# Patient Record
Sex: Female | Born: 1945 | Race: White | Hispanic: No | Marital: Married | Smoking: Never smoker
Health system: Southern US, Community
[De-identification: ages and names within clinical notes are randomized; demographics above are authoritative.]

## PROBLEM LIST (undated history)

## (undated) DIAGNOSIS — K429 Umbilical hernia without obstruction or gangrene: Secondary | ICD-10-CM

## (undated) DIAGNOSIS — N812 Incomplete uterovaginal prolapse: Secondary | ICD-10-CM

## (undated) DIAGNOSIS — N816 Rectocele: Secondary | ICD-10-CM

## (undated) DIAGNOSIS — E119 Type 2 diabetes mellitus without complications: Secondary | ICD-10-CM

## (undated) HISTORY — DX: Incomplete uterovaginal prolapse: N81.2

## (undated) HISTORY — DX: Umbilical hernia without obstruction or gangrene: K42.9

## (undated) HISTORY — DX: Rectocele: N81.6

---

## 2005-09-15 ENCOUNTER — Ambulatory Visit (HOSPITAL_COMMUNITY): Admission: RE | Admit: 2005-09-15 | Discharge: 2005-09-15 | Payer: Self-pay | Admitting: Family Medicine

## 2007-10-04 ENCOUNTER — Ambulatory Visit (HOSPITAL_COMMUNITY): Admission: RE | Admit: 2007-10-04 | Discharge: 2007-10-04 | Payer: Self-pay | Admitting: Family Medicine

## 2007-12-30 ENCOUNTER — Other Ambulatory Visit: Admission: RE | Admit: 2007-12-30 | Discharge: 2007-12-30 | Payer: Self-pay | Admitting: Obstetrics and Gynecology

## 2009-02-20 ENCOUNTER — Other Ambulatory Visit: Admission: RE | Admit: 2009-02-20 | Discharge: 2009-02-20 | Payer: Self-pay | Admitting: Obstetrics and Gynecology

## 2009-02-20 ENCOUNTER — Ambulatory Visit (HOSPITAL_COMMUNITY): Admission: RE | Admit: 2009-02-20 | Discharge: 2009-02-20 | Payer: Self-pay | Admitting: Obstetrics and Gynecology

## 2010-03-23 ENCOUNTER — Encounter: Payer: Self-pay | Admitting: Family Medicine

## 2010-07-29 ENCOUNTER — Other Ambulatory Visit (HOSPITAL_COMMUNITY): Payer: Self-pay | Admitting: Family Medicine

## 2010-07-29 DIAGNOSIS — Z139 Encounter for screening, unspecified: Secondary | ICD-10-CM

## 2010-08-07 ENCOUNTER — Ambulatory Visit (HOSPITAL_COMMUNITY)
Admission: RE | Admit: 2010-08-07 | Discharge: 2010-08-07 | Disposition: A | Discharge status: Home / Self Care | Payer: 59 | Source: Ambulatory Visit | Attending: Family Medicine | Admitting: Family Medicine

## 2010-08-07 DIAGNOSIS — Z1231 Encounter for screening mammogram for malignant neoplasm of breast: Secondary | ICD-10-CM | POA: Insufficient documentation

## 2010-08-07 DIAGNOSIS — Z139 Encounter for screening, unspecified: Secondary | ICD-10-CM

## 2010-09-10 ENCOUNTER — Other Ambulatory Visit (HOSPITAL_COMMUNITY)
Admission: RE | Admit: 2010-09-10 | Discharge: 2010-09-10 | Disposition: A | Discharge status: Home / Self Care | Payer: 59 | Source: Ambulatory Visit | Attending: Obstetrics and Gynecology | Admitting: Obstetrics and Gynecology

## 2010-09-10 ENCOUNTER — Other Ambulatory Visit: Payer: Self-pay | Admitting: Adult Health

## 2010-09-10 DIAGNOSIS — Z01419 Encounter for gynecological examination (general) (routine) without abnormal findings: Secondary | ICD-10-CM | POA: Insufficient documentation

## 2011-08-06 ENCOUNTER — Other Ambulatory Visit: Payer: Self-pay | Admitting: Obstetrics and Gynecology

## 2011-08-06 DIAGNOSIS — Z139 Encounter for screening, unspecified: Secondary | ICD-10-CM

## 2011-08-13 ENCOUNTER — Ambulatory Visit (HOSPITAL_COMMUNITY)
Admission: RE | Admit: 2011-08-13 | Discharge: 2011-08-13 | Disposition: A | Discharge status: Home / Self Care | Payer: PRIVATE HEALTH INSURANCE | Source: Ambulatory Visit | Attending: Obstetrics and Gynecology | Admitting: Obstetrics and Gynecology

## 2011-08-13 DIAGNOSIS — Z1231 Encounter for screening mammogram for malignant neoplasm of breast: Secondary | ICD-10-CM | POA: Insufficient documentation

## 2011-08-13 DIAGNOSIS — Z139 Encounter for screening, unspecified: Secondary | ICD-10-CM

## 2011-09-15 ENCOUNTER — Other Ambulatory Visit: Payer: Self-pay | Admitting: Adult Health

## 2011-09-15 ENCOUNTER — Other Ambulatory Visit (HOSPITAL_COMMUNITY)
Admission: RE | Admit: 2011-09-15 | Discharge: 2011-09-15 | Disposition: A | Discharge status: Home / Self Care | Payer: PRIVATE HEALTH INSURANCE | Source: Ambulatory Visit | Attending: Obstetrics and Gynecology | Admitting: Obstetrics and Gynecology

## 2011-09-15 DIAGNOSIS — Z1159 Encounter for screening for other viral diseases: Secondary | ICD-10-CM | POA: Insufficient documentation

## 2011-09-15 DIAGNOSIS — Z01419 Encounter for gynecological examination (general) (routine) without abnormal findings: Secondary | ICD-10-CM | POA: Insufficient documentation

## 2013-08-22 ENCOUNTER — Other Ambulatory Visit (HOSPITAL_COMMUNITY): Payer: Self-pay | Admitting: Family Medicine

## 2013-08-22 DIAGNOSIS — Z1231 Encounter for screening mammogram for malignant neoplasm of breast: Secondary | ICD-10-CM

## 2013-08-24 ENCOUNTER — Ambulatory Visit (HOSPITAL_COMMUNITY)
Admission: RE | Admit: 2013-08-24 | Discharge: 2013-08-24 | Disposition: A | Discharge status: Home / Self Care | Payer: PRIVATE HEALTH INSURANCE | Source: Ambulatory Visit | Attending: Family Medicine | Admitting: Family Medicine

## 2013-08-24 DIAGNOSIS — Z1231 Encounter for screening mammogram for malignant neoplasm of breast: Secondary | ICD-10-CM | POA: Insufficient documentation

## 2013-09-08 ENCOUNTER — Encounter (INDEPENDENT_AMBULATORY_CARE_PROVIDER_SITE_OTHER): Payer: Self-pay | Admitting: *Deleted

## 2013-09-28 ENCOUNTER — Encounter: Payer: Self-pay | Admitting: Adult Health

## 2013-09-28 ENCOUNTER — Ambulatory Visit (INDEPENDENT_AMBULATORY_CARE_PROVIDER_SITE_OTHER): Payer: No Typology Code available for payment source | Admitting: Adult Health

## 2013-09-28 VITALS — BP 140/82 | HR 76 | Ht 63.0 in | Wt 158.0 lb

## 2013-09-28 DIAGNOSIS — N812 Incomplete uterovaginal prolapse: Secondary | ICD-10-CM

## 2013-09-28 DIAGNOSIS — K429 Umbilical hernia without obstruction or gangrene: Secondary | ICD-10-CM

## 2013-09-28 DIAGNOSIS — Z1212 Encounter for screening for malignant neoplasm of rectum: Secondary | ICD-10-CM

## 2013-09-28 DIAGNOSIS — N816 Rectocele: Secondary | ICD-10-CM

## 2013-09-28 DIAGNOSIS — Z01419 Encounter for gynecological examination (general) (routine) without abnormal findings: Secondary | ICD-10-CM

## 2013-09-28 HISTORY — DX: Incomplete uterovaginal prolapse: N81.2

## 2013-09-28 HISTORY — DX: Rectocele: N81.6

## 2013-09-28 LAB — HEMOCCULT GUIAC POC 1CARD (OFFICE): Fecal Occult Blood, POC: NEGATIVE

## 2013-09-28 NOTE — Progress Notes (Signed)
Patient ID: Kelsey Carr, female   DOB: 11/30/1945, 68 y.o.   MRN: [id number] History of Present Illness:  Kelsey Carr is a 68 year old white female, married in for a gyn physical.No complaints.She has seen Dr Regino SchultzeMcGough and he stooped her BP meds,since she has lost weight.Last pap 09/15/11 normal with negative HPV.  Current Medications, Allergies, Past Medical History, Past Surgical History, Family History and Social History were reviewed in [facility] electronic medical record.     Review of Systems: Patient denies any headaches, blurred vision, shortness of breath, chest pain, abdominal pain, problems with bowel movements, urination, or intercourse. No joint pain or mood swings.   Physical Exam:BP 140/82  Pulse 76  Ht 5\' 3"  (1.6 m)  Wt 158 lb (71.668 kg)  BMI 28.00 kg/m2 General:  Well developed, well nourished, no acute distress Skin:  Warm and dry Neck:  Midline trachea, normal thyroid Lungs; Clear to auscultation bilaterally Breast:  No dominant palpable mass, retraction, or nipple discharge Cardiovascular: Regular rate and rhythm Abdomen:  Soft, non tender, no hepatosplenomegaly, she has umbilical hernia but it does not bother her, discussed if it is out and hurts go to ER Pelvic:  External genitalia is normal in appearance.  The vagina has decrease moisture and rugae, color pale pink. The cervix is bulbous.Has first degree uterine descensus.  Uterus is felt to be normal size, shape, and contour.  No   adnexal masses or tenderness noted. Rectal: Good sphincter tone, no polyps, or hemorrhoids felt.  Hemoccult negative.+rectocele Extremities:  No swelling noted, has some spider veins Psych:  No mood changes, alert and cooperative,seems happy,still works   Impression: Yearly gyn exam no pap Rectocele First degree uterine descensus  Umbilical hernia   Plan: Physical and pap in  2 years Mammogram yearly  Labs with PCP Colonoscopy advised

## 2013-09-28 NOTE — Patient Instructions (Signed)
Pap and physical in  2 year Mammogram yearly Labs with PCP Colonoscopy advised

## 2014-01-01 ENCOUNTER — Encounter: Payer: Self-pay | Admitting: Adult Health

## 2015-08-29 ENCOUNTER — Other Ambulatory Visit (HOSPITAL_COMMUNITY): Payer: Self-pay | Admitting: Family Medicine

## 2015-08-29 DIAGNOSIS — Z1231 Encounter for screening mammogram for malignant neoplasm of breast: Secondary | ICD-10-CM

## 2015-09-02 ENCOUNTER — Ambulatory Visit (HOSPITAL_COMMUNITY)
Admission: RE | Admit: 2015-09-02 | Discharge: 2015-09-02 | Disposition: A | Discharge status: Home / Self Care | Payer: 59 | Source: Ambulatory Visit | Attending: Family Medicine | Admitting: Family Medicine

## 2015-09-02 DIAGNOSIS — Z1231 Encounter for screening mammogram for malignant neoplasm of breast: Secondary | ICD-10-CM | POA: Diagnosis not present

## 2015-09-30 ENCOUNTER — Encounter: Payer: Self-pay | Admitting: Adult Health

## 2015-09-30 ENCOUNTER — Other Ambulatory Visit (HOSPITAL_COMMUNITY)
Admission: RE | Admit: 2015-09-30 | Discharge: 2015-09-30 | Disposition: A | Discharge status: Home / Self Care | Payer: 59 | Source: Ambulatory Visit | Attending: Adult Health | Admitting: Adult Health

## 2015-09-30 ENCOUNTER — Ambulatory Visit (INDEPENDENT_AMBULATORY_CARE_PROVIDER_SITE_OTHER): Payer: 59 | Admitting: Adult Health

## 2015-09-30 VITALS — BP 162/80 | HR 88 | Ht 62.5 in | Wt 142.5 lb

## 2015-09-30 DIAGNOSIS — K429 Umbilical hernia without obstruction or gangrene: Secondary | ICD-10-CM

## 2015-09-30 DIAGNOSIS — Z1151 Encounter for screening for human papillomavirus (HPV): Secondary | ICD-10-CM | POA: Insufficient documentation

## 2015-09-30 DIAGNOSIS — N816 Rectocele: Secondary | ICD-10-CM

## 2015-09-30 DIAGNOSIS — Z01411 Encounter for gynecological examination (general) (routine) with abnormal findings: Secondary | ICD-10-CM | POA: Diagnosis not present

## 2015-09-30 DIAGNOSIS — Z01419 Encounter for gynecological examination (general) (routine) without abnormal findings: Secondary | ICD-10-CM | POA: Insufficient documentation

## 2015-09-30 DIAGNOSIS — Z1211 Encounter for screening for malignant neoplasm of colon: Secondary | ICD-10-CM | POA: Diagnosis not present

## 2015-09-30 LAB — HEMOCCULT GUIAC POC 1CARD (OFFICE): FECAL OCCULT BLD: NEGATIVE

## 2015-09-30 NOTE — Patient Instructions (Signed)
Mammogram yearly Colonoscopy advised Physical in 2 years

## 2015-09-30 NOTE — Progress Notes (Signed)
Patient ID: Kelsey Carr, female   DOB: January 03, 1946, 70 y.o.   MRN: [id number] History of Present Illness: Kelsey Carr is a 70 year old white female, widowed in for a well woman gyn exam and pap. PCP is Dr Phillips Odor.   Current Medications, Allergies, Past Medical History, Past Surgical History, Family History and Social History were reviewed in [facility] record.     Review of Systems: Patient denies any headaches, hearing loss, fatigue, blurred vision, shortness of breath, chest pain, abdominal pain, problems with bowel movements, urination, or intercourse(not having sex). No joint pain or mood swings.    Physical Exam:BP (!) 162/80 (BP Location: Left Arm, Patient Position: Sitting, Cuff Size: Normal)   Pulse 88   Ht 5' 2.5" (1.588 m)   Wt 142 lb 8 oz (64.6 kg)   BMI 25.65 kg/m  General:  Well developed, well nourished, no acute distress Skin:  Warm and dry Neck:  Midline trachea, normal thyroid, good ROM, no lymphadenopathy,no carotid bruits heard  Lungs; Clear to auscultation bilaterally Breast:  No dominant palpable mass, retraction, or nipple discharge Cardiovascular: Regular rate and rhythm Abdomen:  Soft, non tender, no hepatosplenomegaly,has umbilical hernia  Pelvic:  External genitalia is normal in appearance, no lesions.  The vagina is pale with decreased color, moisture and rugae. Urethra has no lesions or masses. The cervix is smooth and atrophic with stenotic os, pap with HPV pefformed.  Uterus is felt to be normal size, shape, and contour.  No adnexal massewes or tenderness noted.Bladder is non tender, no masses felt. Rectal: Good sphincter tone, no polyps, or hemorrhoids felt.  Hemoccult negative.+rectocele Extremities/musculoskeletal:  No swelling or varicosities noted, no clubbing or cyanosis Psych:  No mood changes, alert and cooperative,seems happy   Impression: Well woman gyn exam and pap Rectocele  Umbilical hernia     Plan: Physical in 2  years ,can stop paps if desires or not Mammogram yearly Colonoscopy advised Labs with PCP

## 2015-10-01 LAB — CYTOLOGY - PAP

## 2015-10-17 ENCOUNTER — Encounter (INDEPENDENT_AMBULATORY_CARE_PROVIDER_SITE_OTHER): Payer: Self-pay | Admitting: *Deleted

## 2016-09-07 ENCOUNTER — Other Ambulatory Visit (HOSPITAL_COMMUNITY): Payer: Self-pay | Admitting: Family Medicine

## 2016-09-07 DIAGNOSIS — Z1231 Encounter for screening mammogram for malignant neoplasm of breast: Secondary | ICD-10-CM

## 2016-09-08 ENCOUNTER — Encounter (INDEPENDENT_AMBULATORY_CARE_PROVIDER_SITE_OTHER): Payer: Self-pay | Admitting: *Deleted

## 2016-09-14 ENCOUNTER — Ambulatory Visit (HOSPITAL_COMMUNITY)
Admission: RE | Admit: 2016-09-14 | Discharge: 2016-09-14 | Disposition: A | Discharge status: Home / Self Care | Payer: 59 | Source: Ambulatory Visit | Attending: Family Medicine | Admitting: Family Medicine

## 2016-09-14 DIAGNOSIS — Z1231 Encounter for screening mammogram for malignant neoplasm of breast: Secondary | ICD-10-CM | POA: Diagnosis present

## 2016-10-01 ENCOUNTER — Ambulatory Visit (INDEPENDENT_AMBULATORY_CARE_PROVIDER_SITE_OTHER): Payer: 59 | Admitting: Adult Health

## 2016-10-01 ENCOUNTER — Encounter: Payer: Self-pay | Admitting: Adult Health

## 2016-10-01 VITALS — BP 138/78 | HR 80 | Ht 62.0 in | Wt 131.0 lb

## 2016-10-01 DIAGNOSIS — Z1212 Encounter for screening for malignant neoplasm of rectum: Secondary | ICD-10-CM

## 2016-10-01 DIAGNOSIS — Z1211 Encounter for screening for malignant neoplasm of colon: Secondary | ICD-10-CM | POA: Insufficient documentation

## 2016-10-01 DIAGNOSIS — Z01419 Encounter for gynecological examination (general) (routine) without abnormal findings: Secondary | ICD-10-CM

## 2016-10-01 DIAGNOSIS — K429 Umbilical hernia without obstruction or gangrene: Secondary | ICD-10-CM

## 2016-10-01 DIAGNOSIS — N816 Rectocele: Secondary | ICD-10-CM

## 2016-10-01 LAB — HEMOCCULT GUIAC POC 1CARD (OFFICE): FECAL OCCULT BLD: NEGATIVE

## 2016-10-01 NOTE — Patient Instructions (Signed)
Physical in 1 year 

## 2016-10-01 NOTE — Progress Notes (Signed)
Patient ID: Kelsey QuakerDonna M Carr, female   DOB: 04/30/1945, 71 y.o.   MRN: [id number] History of Present Illness: Kelsey LeashDonna is a 71 year old white female, widowed in for a well woman gyn exam, she had a normal pap with negative HPV 09/30/15. PCP is Dr Phillips OdorGolding.    Current Medications, Allergies, Past Medical History, Past Surgical History, Family History and Social History were reviewed in [facility] electronic medical record.     Review of Systems: Patient denies any headaches, hearing loss, fatigue, blurred vision, shortness of breath, chest pain, abdominal pain, problems with bowel movements, urination, or intercourse(not having sex). No joint pain or mood swings.    Physical Exam: General:  Well developed, well nourished, no acute distress Skin:  Warm and dry Neck:  Midline trachea, normal thyroid, good ROM, no lymphadenopathy,no carotid bruits heard Lungs; Clear to auscultation bilaterally Breast:  No dominant palpable mass, retraction, or nipple discharge Cardiovascular: Regular rate and rhythm Abdomen:  Soft, non tender, no hepatosplenomegaly,small umbilical hernia  Pelvic:  External genitalia is normal in appearance, no lesions.  The vagina is normal in appearance. Urethra has no lesions or masses. The cervix is bulbous. With first degree descensus. Uterus is felt to be normal size, shape, and contour.  No adnexal masses or tenderness noted.Bladder is non tender, no masses felt. Rectal: Good sphincter tone, no polyps, or hemorrhoids felt.  Hemoccult negative.+rectocele Extremities/musculoskeletal:  No swelling or varicosities noted, no clubbing or cyanosis Psych:  No mood changes, alert and cooperative,seems happy PHQ 2 score 0.  Impression: 1. Encounter for well woman exam   2. Rectocele   3. Umbilical hernia without obstruction and without gangrene   4. Screening for colorectal cancer       Plan: Physical in 1 year, pap in 2020 Mammogram yearly Colonoscopy advised Labs with  PCP

## 2017-08-16 ENCOUNTER — Telehealth: Payer: Self-pay | Admitting: *Deleted

## 2017-08-16 DIAGNOSIS — Z1212 Encounter for screening for malignant neoplasm of rectum: Principal | ICD-10-CM

## 2017-08-16 DIAGNOSIS — Z1211 Encounter for screening for malignant neoplasm of colon: Secondary | ICD-10-CM

## 2017-08-16 NOTE — Telephone Encounter (Signed)
[facility]

## 2017-08-31 ENCOUNTER — Ambulatory Visit: Payer: 59

## 2017-09-09 ENCOUNTER — Other Ambulatory Visit (HOSPITAL_COMMUNITY): Payer: Self-pay | Admitting: Family Medicine

## 2017-09-09 DIAGNOSIS — Z1231 Encounter for screening mammogram for malignant neoplasm of breast: Secondary | ICD-10-CM

## 2017-09-16 ENCOUNTER — Ambulatory Visit (HOSPITAL_COMMUNITY)
Admission: RE | Admit: 2017-09-16 | Discharge: 2017-09-16 | Disposition: A | Discharge status: Home / Self Care | Payer: 59 | Source: Ambulatory Visit | Attending: Family Medicine | Admitting: Family Medicine

## 2017-09-16 ENCOUNTER — Ambulatory Visit (HOSPITAL_COMMUNITY): Payer: 59

## 2017-09-16 ENCOUNTER — Encounter (HOSPITAL_COMMUNITY): Payer: Self-pay

## 2017-09-16 DIAGNOSIS — Z1231 Encounter for screening mammogram for malignant neoplasm of breast: Secondary | ICD-10-CM | POA: Diagnosis not present

## 2017-09-20 ENCOUNTER — Other Ambulatory Visit (HOSPITAL_COMMUNITY): Payer: Self-pay | Admitting: Family Medicine

## 2017-09-20 DIAGNOSIS — R928 Other abnormal and inconclusive findings on diagnostic imaging of breast: Secondary | ICD-10-CM

## 2017-09-21 LAB — COLOGUARD

## 2017-09-28 ENCOUNTER — Ambulatory Visit (HOSPITAL_COMMUNITY)
Admission: RE | Admit: 2017-09-28 | Discharge: 2017-09-28 | Disposition: A | Discharge status: Home / Self Care | Payer: 59 | Source: Ambulatory Visit | Attending: Family Medicine | Admitting: Family Medicine

## 2017-09-28 ENCOUNTER — Encounter (HOSPITAL_COMMUNITY): Payer: Self-pay

## 2017-09-28 DIAGNOSIS — R928 Other abnormal and inconclusive findings on diagnostic imaging of breast: Secondary | ICD-10-CM | POA: Diagnosis present

## 2017-10-04 ENCOUNTER — Other Ambulatory Visit (HOSPITAL_COMMUNITY)
Admission: RE | Admit: 2017-10-04 | Discharge: 2017-10-04 | Disposition: A | Discharge status: Home / Self Care | Payer: 59 | Source: Ambulatory Visit | Attending: Adult Health | Admitting: Adult Health

## 2017-10-04 ENCOUNTER — Ambulatory Visit (INDEPENDENT_AMBULATORY_CARE_PROVIDER_SITE_OTHER): Payer: 59 | Admitting: Adult Health

## 2017-10-04 ENCOUNTER — Encounter: Payer: Self-pay | Admitting: Adult Health

## 2017-10-04 ENCOUNTER — Telehealth: Payer: Self-pay | Admitting: Adult Health

## 2017-10-04 VITALS — BP 134/79 | HR 87 | Ht 63.0 in | Wt 121.0 lb

## 2017-10-04 DIAGNOSIS — Z1212 Encounter for screening for malignant neoplasm of rectum: Secondary | ICD-10-CM

## 2017-10-04 DIAGNOSIS — K429 Umbilical hernia without obstruction or gangrene: Secondary | ICD-10-CM

## 2017-10-04 DIAGNOSIS — Z1211 Encounter for screening for malignant neoplasm of colon: Secondary | ICD-10-CM

## 2017-10-04 DIAGNOSIS — Z01419 Encounter for gynecological examination (general) (routine) without abnormal findings: Secondary | ICD-10-CM

## 2017-10-04 DIAGNOSIS — N816 Rectocele: Secondary | ICD-10-CM

## 2017-10-04 LAB — HEMOCCULT GUIAC POC 1CARD (OFFICE): Fecal Occult Blood, POC: NEGATIVE

## 2017-10-04 NOTE — Telephone Encounter (Signed)
Left message that cologuard is negative, results came after your visit

## 2017-10-04 NOTE — Progress Notes (Signed)
Patient ID: Kelsey QuakerDonna M Carr, female   DOB: 03/15/1945, 72 y.o.   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  PCP is Dr Phillips OdorGolding.    Current Medications, Allergies, Past Medical History, Past Surgical History, Family History and Social History were reviewed in [facility] electronic medical record.     Review of Systems: Patient denies any headaches, hearing loss, fatigue, blurred vision, shortness of breath, chest pain, abdominal pain, problems with bowel movements,or intercourse.(not having sex). No joint pain or mood swings. +UI at times.    Physical Exam:BP 134/79 (BP Location: Left Arm, Patient Position: Sitting, Cuff Size: Normal)   Pulse 87   Ht 5\' 3"  (1.6 m)   Wt 121 lb (54.9 kg)   BMI 21.43 kg/m  General:  Well developed, well nourished, no acute distress Skin:  Warm and dry Neck:  Midline trachea, normal thyroid, good ROM, no lymphadenopathy,no carotid bruits heard. Lungs; Clear to auscultation bilaterally Breast:  No dominant palpable mass, retraction, or nipple discharge Cardiovascular: Regular rate and rhythm Abdomen:  Soft, non tender, no hepatosplenomegaly Pelvic:  External genitalia is normal in appearance, no lesions.  The vagina is normal in appearance for age with loss of color, moisture and rugae. Urethra has no lesions or masses. The cervix is smooth, pap with HPV performed.  Uterus is felt to be normal size, shape, and contour.  No adnexal masses or tenderness noted.Bladder is non tender, no masses felt. Rectal: Good sphincter tone, no polyps, or hemorrhoids felt.  Hemoccult negative.+rectocele. Extremities/musculoskeletal:  No swelling or varicosities noted, no clubbing or cyanosis Psych:  No mood changes, alert and cooperative,seems happy PHQ 9 score 1. She has lost 37 lbs since 2015, has changed eating habits.  She did cologaurd in July, no results yet.  Impression: 1.  Encounter for gynecological examination with Papanicolaou smear of cervix   2. Screening for colorectal cancer   3. Rectocele   4. Umbilical hernia without obstruction and without gangrene      Plan: Physical in 2 years No more paps needed, unless desired Mammogram yearly Labs with PCP.

## 2017-10-04 NOTE — Addendum Note (Signed)
Addended by: Federico FlakeNES, Jevan Gaunt A on: 10/04/2017 10:06 AM   Modules accepted: Orders

## 2017-10-06 LAB — CYTOLOGY - PAP
Diagnosis: NEGATIVE
HPV: NOT DETECTED

## 2018-02-02 ENCOUNTER — Telehealth: Payer: Self-pay | Admitting: *Deleted

## 2018-02-02 NOTE — Telephone Encounter (Signed)
[facility] (per [facility]).  Pt cancelled appt with them.  02-02-18  AS

## 2018-12-26 ENCOUNTER — Other Ambulatory Visit (HOSPITAL_COMMUNITY): Payer: Self-pay | Admitting: Family Medicine

## 2018-12-26 DIAGNOSIS — Z1231 Encounter for screening mammogram for malignant neoplasm of breast: Secondary | ICD-10-CM

## 2019-01-12 ENCOUNTER — Ambulatory Visit (HOSPITAL_COMMUNITY)
Admission: RE | Admit: 2019-01-12 | Discharge: 2019-01-12 | Disposition: A | Discharge status: Home / Self Care | Payer: PPO | Source: Ambulatory Visit | Attending: Family Medicine | Admitting: Family Medicine

## 2019-01-12 ENCOUNTER — Other Ambulatory Visit: Payer: Self-pay

## 2019-01-12 DIAGNOSIS — Z1231 Encounter for screening mammogram for malignant neoplasm of breast: Secondary | ICD-10-CM | POA: Diagnosis not present

## 2019-03-14 ENCOUNTER — Other Ambulatory Visit: Payer: Self-pay

## 2019-03-14 ENCOUNTER — Ambulatory Visit: Payer: PPO | Attending: Internal Medicine

## 2019-03-14 DIAGNOSIS — Z20822 Contact with and (suspected) exposure to covid-19: Secondary | ICD-10-CM | POA: Diagnosis not present

## 2019-03-15 ENCOUNTER — Other Ambulatory Visit: Payer: 59 | Admitting: Adult Health

## 2019-03-15 LAB — NOVEL CORONAVIRUS, NAA: SARS-CoV-2, NAA: DETECTED — AB

## 2019-03-17 ENCOUNTER — Telehealth: Payer: Self-pay | Admitting: Nurse Practitioner

## 2019-03-17 NOTE — Telephone Encounter (Signed)
Called to Discuss with patient about Covid symptoms and the use of bamlanivimab, a monoclonal antibody infusion for those with mild to moderate Covid symptoms and at a high risk of hospitalization.     Pt is qualified for this infusion at the [facility] due to co-morbid conditions and/or a member of an at-risk group.     Unable to reach pt  

## 2019-04-19 ENCOUNTER — Telehealth: Payer: Self-pay | Admitting: Adult Health

## 2019-04-19 NOTE — Telephone Encounter (Signed)
We have you scheduled for an upcoming appointment at our office. At this time, we are still not allowing visitors during your appointment.  We recommend support persons remain outside until the visit is complete.   We ask if you are sick, have any of the following symptoms of COVID such as chills, fever, cough, shortness of breath, muscle pain, diarrhea, rash, vomiting, abdominal pain, red eye, weakness, bruising, bleeding, joint pain, loss of taste or smell, a severe headache, sore throat, fatigue, have had any exposure to anyone suspected or confirmed of having COVID-19, or are awaiting test results for COVID-19, to call our office as we may need to reschedule you for a virtual visit or schedule your appointment for a later date.    Please know we will ask you these questions or similar questions when you arrive for your appointment and again it's how we are keeping everyone safe.    Also,to keep you safe, please use the provided hand sanitizer when you enter the office. We are asking everyone in the office to wear a mask to help prevent the spread of germs. If you have a mask of your own, please wear it to your appointment, if not, we are happy to provide one for you.  Thank you for understanding and your cooperation.    [facility] Staff

## 2019-04-20 ENCOUNTER — Other Ambulatory Visit: Payer: PPO | Admitting: Adult Health

## 2019-05-16 ENCOUNTER — Telehealth: Payer: Self-pay | Admitting: Adult Health

## 2019-05-16 NOTE — Telephone Encounter (Signed)

## 2019-05-17 ENCOUNTER — Encounter: Payer: Self-pay | Admitting: Adult Health

## 2019-05-17 ENCOUNTER — Ambulatory Visit: Payer: PPO | Admitting: Adult Health

## 2019-05-17 ENCOUNTER — Other Ambulatory Visit: Payer: Self-pay

## 2019-05-17 VITALS — BP 154/77 | HR 92 | Ht 63.0 in | Wt 119.4 lb

## 2019-05-17 DIAGNOSIS — Z1212 Encounter for screening for malignant neoplasm of rectum: Secondary | ICD-10-CM | POA: Diagnosis not present

## 2019-05-17 DIAGNOSIS — N816 Rectocele: Secondary | ICD-10-CM

## 2019-05-17 DIAGNOSIS — Z01419 Encounter for gynecological examination (general) (routine) without abnormal findings: Secondary | ICD-10-CM

## 2019-05-17 DIAGNOSIS — K429 Umbilical hernia without obstruction or gangrene: Secondary | ICD-10-CM

## 2019-05-17 DIAGNOSIS — Z1211 Encounter for screening for malignant neoplasm of colon: Secondary | ICD-10-CM | POA: Diagnosis not present

## 2019-05-17 LAB — HEMOCCULT GUIAC POC 1CARD (OFFICE): Fecal Occult Blood, POC: NEGATIVE

## 2019-05-17 NOTE — Progress Notes (Signed)
Patient ID: Kelsey Carr, female   DOB: 10-23-1945, 74 y.o.   MRN: [id number] History of Present Illness:  Kelsey Carr is a 74 year old white female, widowed,PM in for a well woman gyn exam, she had a normal pap with negative HPV 10/04/17, so no more pap smears.She says she had COVID 03/05/19. PCP is Dr Phillips Odor.  Current Medications, Allergies, Past Medical History, Past Surgical History, Family History and Social History were reviewed in [facility] record.     Review of Systems: Patient denies any headaches, hearing loss, fatigue, blurred vision, shortness of breath, chest pain, abdominal pain, problems with bowel movements, urination, or intercourse(not active). No joint pain or mood swings.    Physical Exam:BP (!) 154/77 (BP Location: Left Arm, Patient Position: Sitting, Cuff Size: Normal)   Pulse 92   Ht 5\' 3"  (1.6 m)   Wt 119 lb 6.4 oz (54.2 kg)   BMI 21.15 kg/m  General:  Well developed, well nourished, no acute distress Skin:  Warm and dry Neck:  Midline trachea, normal thyroid, good ROM, no lymphadenopathy,no carotid bruits heard  Lungs; Clear to auscultation bilaterally Breast:  No dominant palpable mass, retraction, or nipple discharge Cardiovascular: Regular rate and rhythm Abdomen:  Soft, non tender, no hepatosplenomegaly,has umbilical hernia that is reducible.  Pelvic:  External genitalia is normal in appearance, no lesions.  The vagina is pale with loss of moisture and rugae. Urethra has no lesions or masses. The cervix is smooth.  Uterus is felt to be normal size, shape, and contour.Has some relaxation.  No adnexal masses or tenderness noted.Bladder is non tender, no masses felt. Rectal: Fair sphincter tone, no polyps, or hemorrhoids felt.  Hemoccult negative. +rectocele, given medical explainer #4  Extremities/musculoskeletal:  No swelling or varicosities noted, no clubbing or cyanosis Psych:  No mood changes, alert and cooperative,seems happy Fall risk is  low PHQ 9 score is 0 Examination chaperoned by CMA   Impression and Plan:   1. Encounter for well woman exam with routine gynecological exam Physical in 2 years  No mor paps Labs with PCP Mammogram yearly  2. Screening for colorectal cancer She does cologuard   3. Rectocele Try not be constipated or have excessive diarrhea  4. Umbilical hernia without obstruction and without gangrene If has pain or can not push back in go to ER

## 2019-09-20 DIAGNOSIS — H25813 Combined forms of age-related cataract, bilateral: Secondary | ICD-10-CM | POA: Diagnosis not present

## 2019-09-20 DIAGNOSIS — E113513 Type 2 diabetes mellitus with proliferative diabetic retinopathy with macular edema, bilateral: Secondary | ICD-10-CM | POA: Diagnosis not present

## 2019-09-20 DIAGNOSIS — H40023 Open angle with borderline findings, high risk, bilateral: Secondary | ICD-10-CM | POA: Diagnosis not present

## 2019-10-09 DIAGNOSIS — N816 Rectocele: Secondary | ICD-10-CM | POA: Diagnosis not present

## 2019-10-09 DIAGNOSIS — H547 Unspecified visual loss: Secondary | ICD-10-CM | POA: Diagnosis not present

## 2019-10-12 DIAGNOSIS — E113413 Type 2 diabetes mellitus with severe nonproliferative diabetic retinopathy with macular edema, bilateral: Secondary | ICD-10-CM | POA: Diagnosis not present

## 2019-10-12 DIAGNOSIS — H40023 Open angle with borderline findings, high risk, bilateral: Secondary | ICD-10-CM | POA: Diagnosis not present

## 2019-10-12 DIAGNOSIS — H25813 Combined forms of age-related cataract, bilateral: Secondary | ICD-10-CM | POA: Diagnosis not present

## 2019-10-18 DIAGNOSIS — E113411 Type 2 diabetes mellitus with severe nonproliferative diabetic retinopathy with macular edema, right eye: Secondary | ICD-10-CM | POA: Diagnosis not present

## 2019-10-18 DIAGNOSIS — E113412 Type 2 diabetes mellitus with severe nonproliferative diabetic retinopathy with macular edema, left eye: Secondary | ICD-10-CM | POA: Diagnosis not present

## 2019-11-01 DIAGNOSIS — E113412 Type 2 diabetes mellitus with severe nonproliferative diabetic retinopathy with macular edema, left eye: Secondary | ICD-10-CM | POA: Diagnosis not present

## 2019-11-01 DIAGNOSIS — E113411 Type 2 diabetes mellitus with severe nonproliferative diabetic retinopathy with macular edema, right eye: Secondary | ICD-10-CM | POA: Diagnosis not present

## 2019-11-15 DIAGNOSIS — E113411 Type 2 diabetes mellitus with severe nonproliferative diabetic retinopathy with macular edema, right eye: Secondary | ICD-10-CM | POA: Diagnosis not present

## 2019-11-15 DIAGNOSIS — E113412 Type 2 diabetes mellitus with severe nonproliferative diabetic retinopathy with macular edema, left eye: Secondary | ICD-10-CM | POA: Diagnosis not present

## 2019-11-28 DIAGNOSIS — Z1211 Encounter for screening for malignant neoplasm of colon: Secondary | ICD-10-CM | POA: Diagnosis not present

## 2019-11-28 DIAGNOSIS — Z1329 Encounter for screening for other suspected endocrine disorder: Secondary | ICD-10-CM | POA: Diagnosis not present

## 2019-11-28 DIAGNOSIS — Z13220 Encounter for screening for lipoid disorders: Secondary | ICD-10-CM | POA: Diagnosis not present

## 2019-11-28 DIAGNOSIS — Z131 Encounter for screening for diabetes mellitus: Secondary | ICD-10-CM | POA: Diagnosis not present

## 2019-11-28 DIAGNOSIS — Z130 Encounter for screening for diseases of the blood and blood-forming organs and certain disorders involving the immune mechanism: Secondary | ICD-10-CM | POA: Diagnosis not present

## 2019-11-28 DIAGNOSIS — Z13228 Encounter for screening for other metabolic disorders: Secondary | ICD-10-CM | POA: Diagnosis not present

## 2019-11-28 DIAGNOSIS — Z0000 Encounter for general adult medical examination without abnormal findings: Secondary | ICD-10-CM | POA: Diagnosis not present

## 2019-11-29 DIAGNOSIS — E113411 Type 2 diabetes mellitus with severe nonproliferative diabetic retinopathy with macular edema, right eye: Secondary | ICD-10-CM | POA: Diagnosis not present

## 2019-11-29 DIAGNOSIS — E113412 Type 2 diabetes mellitus with severe nonproliferative diabetic retinopathy with macular edema, left eye: Secondary | ICD-10-CM | POA: Diagnosis not present

## 2019-12-13 DIAGNOSIS — H40023 Open angle with borderline findings, high risk, bilateral: Secondary | ICD-10-CM | POA: Diagnosis not present

## 2019-12-13 DIAGNOSIS — E113411 Type 2 diabetes mellitus with severe nonproliferative diabetic retinopathy with macular edema, right eye: Secondary | ICD-10-CM | POA: Diagnosis not present

## 2019-12-13 DIAGNOSIS — E113412 Type 2 diabetes mellitus with severe nonproliferative diabetic retinopathy with macular edema, left eye: Secondary | ICD-10-CM | POA: Diagnosis not present

## 2020-01-17 DIAGNOSIS — E113411 Type 2 diabetes mellitus with severe nonproliferative diabetic retinopathy with macular edema, right eye: Secondary | ICD-10-CM | POA: Diagnosis not present

## 2020-01-17 DIAGNOSIS — E113412 Type 2 diabetes mellitus with severe nonproliferative diabetic retinopathy with macular edema, left eye: Secondary | ICD-10-CM | POA: Diagnosis not present

## 2020-01-17 DIAGNOSIS — H40023 Open angle with borderline findings, high risk, bilateral: Secondary | ICD-10-CM | POA: Diagnosis not present

## 2020-01-23 DIAGNOSIS — H40023 Open angle with borderline findings, high risk, bilateral: Secondary | ICD-10-CM | POA: Diagnosis not present

## 2020-01-23 DIAGNOSIS — E113412 Type 2 diabetes mellitus with severe nonproliferative diabetic retinopathy with macular edema, left eye: Secondary | ICD-10-CM | POA: Diagnosis not present

## 2020-01-23 DIAGNOSIS — E113411 Type 2 diabetes mellitus with severe nonproliferative diabetic retinopathy with macular edema, right eye: Secondary | ICD-10-CM | POA: Diagnosis not present

## 2020-02-20 DIAGNOSIS — E113412 Type 2 diabetes mellitus with severe nonproliferative diabetic retinopathy with macular edema, left eye: Secondary | ICD-10-CM | POA: Diagnosis not present

## 2020-02-20 DIAGNOSIS — E113411 Type 2 diabetes mellitus with severe nonproliferative diabetic retinopathy with macular edema, right eye: Secondary | ICD-10-CM | POA: Diagnosis not present

## 2020-02-20 DIAGNOSIS — H40023 Open angle with borderline findings, high risk, bilateral: Secondary | ICD-10-CM | POA: Diagnosis not present

## 2020-03-12 DIAGNOSIS — E113411 Type 2 diabetes mellitus with severe nonproliferative diabetic retinopathy with macular edema, right eye: Secondary | ICD-10-CM | POA: Diagnosis not present

## 2020-03-12 DIAGNOSIS — E113412 Type 2 diabetes mellitus with severe nonproliferative diabetic retinopathy with macular edema, left eye: Secondary | ICD-10-CM | POA: Diagnosis not present

## 2020-03-12 DIAGNOSIS — H40023 Open angle with borderline findings, high risk, bilateral: Secondary | ICD-10-CM | POA: Diagnosis not present

## 2020-03-12 DIAGNOSIS — H2513 Age-related nuclear cataract, bilateral: Secondary | ICD-10-CM | POA: Diagnosis not present

## 2020-04-02 DIAGNOSIS — E113411 Type 2 diabetes mellitus with severe nonproliferative diabetic retinopathy with macular edema, right eye: Secondary | ICD-10-CM | POA: Diagnosis not present

## 2020-04-02 DIAGNOSIS — H40023 Open angle with borderline findings, high risk, bilateral: Secondary | ICD-10-CM | POA: Diagnosis not present

## 2020-04-02 DIAGNOSIS — H2513 Age-related nuclear cataract, bilateral: Secondary | ICD-10-CM | POA: Diagnosis not present

## 2020-04-02 DIAGNOSIS — E113412 Type 2 diabetes mellitus with severe nonproliferative diabetic retinopathy with macular edema, left eye: Secondary | ICD-10-CM | POA: Diagnosis not present

## 2020-04-18 ENCOUNTER — Other Ambulatory Visit (HOSPITAL_COMMUNITY): Payer: Self-pay | Admitting: Nurse Practitioner

## 2020-04-18 DIAGNOSIS — Z1231 Encounter for screening mammogram for malignant neoplasm of breast: Secondary | ICD-10-CM

## 2020-05-01 ENCOUNTER — Ambulatory Visit (HOSPITAL_COMMUNITY)
Admission: RE | Admit: 2020-05-01 | Discharge: 2020-05-01 | Disposition: A | Discharge status: Home / Self Care | Payer: PPO | Source: Ambulatory Visit | Attending: Nurse Practitioner | Admitting: Nurse Practitioner

## 2020-05-01 ENCOUNTER — Other Ambulatory Visit: Payer: Self-pay

## 2020-05-01 DIAGNOSIS — Z1231 Encounter for screening mammogram for malignant neoplasm of breast: Secondary | ICD-10-CM | POA: Insufficient documentation

## 2020-05-07 DIAGNOSIS — H40023 Open angle with borderline findings, high risk, bilateral: Secondary | ICD-10-CM | POA: Diagnosis not present

## 2020-05-07 DIAGNOSIS — H2513 Age-related nuclear cataract, bilateral: Secondary | ICD-10-CM | POA: Diagnosis not present

## 2020-05-07 DIAGNOSIS — E113411 Type 2 diabetes mellitus with severe nonproliferative diabetic retinopathy with macular edema, right eye: Secondary | ICD-10-CM | POA: Diagnosis not present

## 2020-05-07 DIAGNOSIS — E113412 Type 2 diabetes mellitus with severe nonproliferative diabetic retinopathy with macular edema, left eye: Secondary | ICD-10-CM | POA: Diagnosis not present

## 2020-05-28 DIAGNOSIS — H2513 Age-related nuclear cataract, bilateral: Secondary | ICD-10-CM | POA: Diagnosis not present

## 2020-05-28 DIAGNOSIS — E113412 Type 2 diabetes mellitus with severe nonproliferative diabetic retinopathy with macular edema, left eye: Secondary | ICD-10-CM | POA: Diagnosis not present

## 2020-05-28 DIAGNOSIS — H40023 Open angle with borderline findings, high risk, bilateral: Secondary | ICD-10-CM | POA: Diagnosis not present

## 2020-05-28 DIAGNOSIS — E113411 Type 2 diabetes mellitus with severe nonproliferative diabetic retinopathy with macular edema, right eye: Secondary | ICD-10-CM | POA: Diagnosis not present

## 2020-06-12 ENCOUNTER — Other Ambulatory Visit: Payer: Self-pay | Admitting: Adult Health

## 2020-06-13 DIAGNOSIS — H40013 Open angle with borderline findings, low risk, bilateral: Secondary | ICD-10-CM | POA: Diagnosis not present

## 2020-06-13 DIAGNOSIS — H2513 Age-related nuclear cataract, bilateral: Secondary | ICD-10-CM | POA: Diagnosis not present

## 2020-06-13 DIAGNOSIS — H3581 Retinal edema: Secondary | ICD-10-CM | POA: Diagnosis not present

## 2020-06-13 DIAGNOSIS — E113313 Type 2 diabetes mellitus with moderate nonproliferative diabetic retinopathy with macular edema, bilateral: Secondary | ICD-10-CM | POA: Diagnosis not present

## 2020-07-04 DIAGNOSIS — Z0000 Encounter for general adult medical examination without abnormal findings: Secondary | ICD-10-CM | POA: Diagnosis not present

## 2020-07-04 DIAGNOSIS — Z1329 Encounter for screening for other suspected endocrine disorder: Secondary | ICD-10-CM | POA: Diagnosis not present

## 2020-07-04 DIAGNOSIS — Z131 Encounter for screening for diabetes mellitus: Secondary | ICD-10-CM | POA: Diagnosis not present

## 2020-07-04 DIAGNOSIS — Z13228 Encounter for screening for other metabolic disorders: Secondary | ICD-10-CM | POA: Diagnosis not present

## 2020-07-04 DIAGNOSIS — Z13220 Encounter for screening for lipoid disorders: Secondary | ICD-10-CM | POA: Diagnosis not present

## 2020-07-04 DIAGNOSIS — Z130 Encounter for screening for diseases of the blood and blood-forming organs and certain disorders involving the immune mechanism: Secondary | ICD-10-CM | POA: Diagnosis not present

## 2020-07-08 DIAGNOSIS — E119 Type 2 diabetes mellitus without complications: Secondary | ICD-10-CM | POA: Diagnosis not present

## 2020-07-08 DIAGNOSIS — E782 Mixed hyperlipidemia: Secondary | ICD-10-CM | POA: Diagnosis not present

## 2020-07-08 DIAGNOSIS — N390 Urinary tract infection, site not specified: Secondary | ICD-10-CM | POA: Diagnosis not present

## 2020-07-09 DIAGNOSIS — E113412 Type 2 diabetes mellitus with severe nonproliferative diabetic retinopathy with macular edema, left eye: Secondary | ICD-10-CM | POA: Diagnosis not present

## 2020-07-09 DIAGNOSIS — H2513 Age-related nuclear cataract, bilateral: Secondary | ICD-10-CM | POA: Diagnosis not present

## 2020-07-09 DIAGNOSIS — H40023 Open angle with borderline findings, high risk, bilateral: Secondary | ICD-10-CM | POA: Diagnosis not present

## 2020-07-09 DIAGNOSIS — E113411 Type 2 diabetes mellitus with severe nonproliferative diabetic retinopathy with macular edema, right eye: Secondary | ICD-10-CM | POA: Diagnosis not present

## 2020-07-23 DIAGNOSIS — H40023 Open angle with borderline findings, high risk, bilateral: Secondary | ICD-10-CM | POA: Diagnosis not present

## 2020-07-23 DIAGNOSIS — E113411 Type 2 diabetes mellitus with severe nonproliferative diabetic retinopathy with macular edema, right eye: Secondary | ICD-10-CM | POA: Diagnosis not present

## 2020-07-23 DIAGNOSIS — H2513 Age-related nuclear cataract, bilateral: Secondary | ICD-10-CM | POA: Diagnosis not present

## 2020-07-23 DIAGNOSIS — E113412 Type 2 diabetes mellitus with severe nonproliferative diabetic retinopathy with macular edema, left eye: Secondary | ICD-10-CM | POA: Diagnosis not present

## 2020-07-30 ENCOUNTER — Encounter (HOSPITAL_COMMUNITY)
Admission: RE | Admit: 2020-07-30 | Discharge: 2020-07-30 | Disposition: A | Discharge status: Home / Self Care | Payer: PPO | Source: Ambulatory Visit | Attending: Ophthalmology | Admitting: Ophthalmology

## 2020-07-30 ENCOUNTER — Encounter (HOSPITAL_COMMUNITY): Payer: Self-pay

## 2020-07-30 ENCOUNTER — Other Ambulatory Visit: Payer: Self-pay

## 2020-07-30 HISTORY — DX: Type 2 diabetes mellitus without complications: E11.9

## 2020-07-30 NOTE — H&P (Signed)
Surgical History & Physical  Patient Name: Kelsey Carr DOB: [birth year]  Surgery: Cataract extraction with intraocular lens implant phacoemulsification; Right Eye  Surgeon: Fabio Pierce MD Surgery Date:  08/05/2020 Pre-Op Date:  07/11/2020  HPI: A 84 Yr. old female patient is referred by Dr Linward Headland from [facility] for cataract eval. 1. The patient complains of difficulty when driving due to glare from headlights or sun, which began 1 year ago. Both eyes are affected. The episode is gradual. The condition's severity increased since last visit. Symptoms occur when the patient is driving and outside. This is negatively affecting her quality of life. HPI was performed by Fabio Pierce .  Medical History: Glaucoma Cataracts OD: NPDR with Macula Edema Arthritis Diabetes  Review of Systems Negative Allergic/Immunologic Negative Cardiovascular Negative Constitutional Negative Ear, Nose, Mouth & Throat Negative Endocrine Negative Eyes Negative Gastrointestinal Negative Genitourinary Negative Hemotologic/Lymphatic Negative Integumentary Negative Musculoskeletal Negative Neurological Negative Psychiatry Negative Respiratory  Social   Never smoked   Medication  None  Sx/Procedures Eylea injections for ME,   Drug Allergies   NKDA  History & Physical: Heent:  Cataract, Right eye NECK: supple without bruits LUNGS: lungs clear to auscultation CV: regular rate and rhythm Abdomen: soft and non-tender  Impression & Plan: Assessment: 1.  NUCLEAR SCLEROSIS AGE RELATED; Both Eyes (H25.13) 2.  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  [place] Spot (H35.81) 4.  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  Cataract accounts for the patient's decreased vision. This visual impairment is not correctable with a tolerable change in glasses or contact lenses. Cataract surgery with an implantation of a new lens should significantly improve the visual  and functional status of the patient. Discussed all risks, benefits, alternatives, and potential complications. Discussed the procedures and recovery. Patient desires to have surgery. A-scan ordered and performed today for intra-ocular lens calculations. The surgery will be performed in order to improve vision for driving, reading, and for eye examinations. Recommend phacoemulsification with intra-ocular lens. Recommend Dextenza for post-operative pain and inflammation. Right Eye worse - first. Dilates well - shugarcaine by protocol. Diabetic edema - will coordinate with retina. 2.  IOP WNL OU. OCT rNFL shows thinning - will repeat after cataract surgery. 3.  from diabetic retinopathy. 4.  Under the care of Dr. Roanna Raider - appreciate her assistance with this very pleasant patient.

## 2020-08-02 ENCOUNTER — Other Ambulatory Visit (HOSPITAL_COMMUNITY): Payer: PPO

## 2020-08-02 DIAGNOSIS — H2511 Age-related nuclear cataract, right eye: Secondary | ICD-10-CM | POA: Diagnosis not present

## 2020-08-05 NOTE — OR Nursing (Signed)
Procedure cancelled ,  Dr. Wrozek sick. Patient notified 

## 2020-08-06 ENCOUNTER — Encounter (HOSPITAL_COMMUNITY)
Admission: RE | Admit: 2020-08-06 | Discharge: 2020-08-06 | Disposition: A | Discharge status: Home / Self Care | Payer: PPO | Source: Ambulatory Visit | Attending: Ophthalmology | Admitting: Ophthalmology

## 2020-08-06 ENCOUNTER — Other Ambulatory Visit: Payer: Self-pay

## 2020-08-08 ENCOUNTER — Encounter (HOSPITAL_COMMUNITY): Payer: Self-pay | Admitting: Ophthalmology

## 2020-08-08 ENCOUNTER — Encounter (HOSPITAL_COMMUNITY)
Admission: RE | Disposition: A | Discharge status: Home / Self Care | Payer: Self-pay | Source: Home / Self Care | Attending: Ophthalmology

## 2020-08-08 ENCOUNTER — Ambulatory Visit (HOSPITAL_COMMUNITY)
Admission: RE | Admit: 2020-08-08 | Discharge: 2020-08-08 | Disposition: A | Discharge status: Home / Self Care | Payer: PPO | Attending: Ophthalmology | Admitting: Ophthalmology

## 2020-08-08 ENCOUNTER — Ambulatory Visit (HOSPITAL_COMMUNITY): Payer: PPO | Admitting: Anesthesiology

## 2020-08-08 ENCOUNTER — Other Ambulatory Visit: Payer: Self-pay

## 2020-08-08 DIAGNOSIS — H2513 Age-related nuclear cataract, bilateral: Secondary | ICD-10-CM | POA: Insufficient documentation

## 2020-08-08 DIAGNOSIS — H3581 Retinal edema: Secondary | ICD-10-CM | POA: Insufficient documentation

## 2020-08-08 DIAGNOSIS — E1136 Type 2 diabetes mellitus with diabetic cataract: Secondary | ICD-10-CM | POA: Diagnosis not present

## 2020-08-08 DIAGNOSIS — E113313 Type 2 diabetes mellitus with moderate nonproliferative diabetic retinopathy with macular edema, bilateral: Secondary | ICD-10-CM | POA: Insufficient documentation

## 2020-08-08 DIAGNOSIS — H40013 Open angle with borderline findings, low risk, bilateral: Secondary | ICD-10-CM | POA: Insufficient documentation

## 2020-08-08 DIAGNOSIS — H2511 Age-related nuclear cataract, right eye: Secondary | ICD-10-CM | POA: Diagnosis not present

## 2020-08-08 HISTORY — PX: CATARACT EXTRACTION W/PHACO: SHX586

## 2020-08-08 LAB — GLUCOSE, CAPILLARY
Glucose-Capillary: 266 mg/dL — ABNORMAL HIGH (ref 70–99)
Glucose-Capillary: 264 mg/dL — ABNORMAL HIGH (ref 70–99)

## 2020-08-08 SURGERY — PHACOEMULSIFICATION, CATARACT, WITH IOL INSERTION
Anesthesia: Monitor Anesthesia Care | Site: Eye | Laterality: Right

## 2020-08-08 MED ORDER — STERILE WATER FOR IRRIGATION IR SOLN
Status: DC | PRN
  Administered 2020-08-08: 250 mL

## 2020-08-08 MED ORDER — SODIUM HYALURONATE 10 MG/ML IO SOLUTION
PREFILLED_SYRINGE | INTRAOCULAR | Status: DC | PRN
  Administered 2020-08-08: 0.85 mL via INTRAOCULAR

## 2020-08-08 MED ORDER — POVIDONE-IODINE 5 % OP SOLN
OPHTHALMIC | Status: DC | PRN
  Administered 2020-08-08: 1 via OPHTHALMIC

## 2020-08-08 MED ORDER — LIDOCAINE HCL (PF) 1 % IJ SOLN
INTRAOCULAR | Status: DC | PRN
  Administered 2020-08-08: 1 mL via OPHTHALMIC

## 2020-08-08 MED ORDER — EPINEPHRINE PF 1 MG/ML IJ SOLN
INTRAOCULAR | Status: DC | PRN
  Administered 2020-08-08: 500 mL

## 2020-08-08 MED ORDER — LIDOCAINE HCL 3.5 % OP GEL
1.0000 "application " | Freq: Once | OPHTHALMIC | Status: AC
  Administered 2020-08-08: 1 via OPHTHALMIC

## 2020-08-08 MED ORDER — MIDAZOLAM HCL 2 MG/2ML IJ SOLN
INTRAMUSCULAR | Status: AC
  Filled 2020-08-08: qty 2

## 2020-08-08 MED ORDER — TROPICAMIDE 1 % OP SOLN
1.0000 [drp] | OPHTHALMIC | Status: AC
  Administered 2020-08-08 (×3): 1 [drp] via OPHTHALMIC

## 2020-08-08 MED ORDER — PHENYLEPHRINE HCL 2.5 % OP SOLN
1.0000 [drp] | OPHTHALMIC | Status: AC | PRN
  Administered 2020-08-08 (×3): 1 [drp] via OPHTHALMIC

## 2020-08-08 MED ORDER — TETRACAINE HCL 0.5 % OP SOLN
1.0000 [drp] | OPHTHALMIC | Status: AC | PRN
  Administered 2020-08-08 (×3): 1 [drp] via OPHTHALMIC

## 2020-08-08 MED ORDER — NEOMYCIN-POLYMYXIN-DEXAMETH 3.5-10000-0.1 OP SUSP
OPHTHALMIC | Status: DC | PRN
  Administered 2020-08-08: 1 [drp] via OPHTHALMIC

## 2020-08-08 MED ORDER — BSS IO SOLN
INTRAOCULAR | Status: DC | PRN
  Administered 2020-08-08: 15 mL via INTRAOCULAR

## 2020-08-08 MED ORDER — SODIUM HYALURONATE 23MG/ML IO SOSY
PREFILLED_SYRINGE | INTRAOCULAR | Status: DC | PRN
  Administered 2020-08-08: 0.6 mL via INTRAOCULAR

## 2020-08-08 SURGICAL SUPPLY — 11 items
CLOTH BEACON ORANGE TIMEOUT ST (SAFETY) ×3 IMPLANT
EYE SHIELD UNIVERSAL CLEAR (GAUZE/BANDAGES/DRESSINGS) ×3 IMPLANT
GLOVE SURG UNDER POLY LF SZ6.5 (GLOVE) ×3 IMPLANT
GLOVE SURG UNDER POLY LF SZ7 (GLOVE) ×3 IMPLANT
NEEDLE HYPO 18GX1.5 BLUNT FILL (NEEDLE) ×3 IMPLANT
PAD ARMBOARD 7.5X6 YLW CONV (MISCELLANEOUS) ×3 IMPLANT
SYR TB 1ML LL NO SAFETY (SYRINGE) ×3 IMPLANT
TAPE SURG TRANSPORE 1 IN (GAUZE/BANDAGES/DRESSINGS) ×1 IMPLANT
TAPE SURGICAL TRANSPORE 1 IN (GAUZE/BANDAGES/DRESSINGS) ×3
TECNIS IOL (Intraocular Lens) ×3 IMPLANT
WATER STERILE IRR 250ML POUR (IV SOLUTION) ×3 IMPLANT

## 2020-08-08 NOTE — Anesthesia Postprocedure Evaluation (Signed)
Anesthesia Post Note  Patient: Kelsey Carr  Procedure(s) Performed: CATARACT EXTRACTION PHACO AND INTRAOCULAR LENS PLACEMENT (IOC) (Right: Eye)  Patient location during evaluation: Phase II Anesthesia Type: MAC Level of consciousness: awake and alert and oriented Pain management: pain level controlled Vital Signs Assessment: post-procedure vital signs reviewed and stable Respiratory status: spontaneous breathing and respiratory function stable Cardiovascular status: blood pressure returned to baseline and stable Postop Assessment: no apparent nausea or vomiting Anesthetic complications: no   No notable events documented.   Last Vitals:  Vitals:   08/08/20 1119 08/08/20 1237  BP: (P) 120/61 (!) 171/74  Pulse: (P) 89 84  Resp: (P) 18 16  Temp: (P) 36.8 C 36.8 C  SpO2: (P) 98% 97%    Last Pain:  Vitals:   08/08/20 1237  TempSrc: Oral  PainSc: 0-No pain                 Suni Jarnagin C Margaurite Salido

## 2020-08-08 NOTE — Anesthesia Procedure Notes (Addendum)
Procedure Name: MAC Date/Time: 08/08/2020 12:18 PM Performed by: Orlie Dakin, CRNA Pre-anesthesia Checklist: Patient identified, Emergency Drugs available, Suction available and Patient being monitored Patient Re-evaluated:Patient Re-evaluated prior to induction Oxygen Delivery Method: Nasal cannula Placement Confirmation: positive ETCO2

## 2020-08-08 NOTE — Transfer of Care (Signed)
Immediate Anesthesia Transfer of Care Note  Patient: Kelsey Carr  Procedure(s) Performed: CATARACT EXTRACTION PHACO AND INTRAOCULAR LENS PLACEMENT (IOC) (Right: Eye)  Patient Location: Short Stay  Anesthesia Type:MAC  Level of Consciousness: awake, alert  and oriented  Airway & Oxygen Therapy: Patient Spontanous Breathing  Post-op Assessment: Report given to RN and Post -op Vital signs reviewed and stable  Post vital signs: Reviewed and stable  Last Vitals:  Vitals Value Taken Time  BP    Temp    Pulse    Resp    SpO2      Last Pain:  Vitals:   08/08/20 1119  TempSrc: (P) Oral         Complications: No notable events documented.

## 2020-08-08 NOTE — Op Note (Signed)
Date of procedure: 08/08/20  Pre-operative diagnosis: Visually significant age-related nuclear cataract, Right Eye (H25.11)  Post-operative diagnosis: Visually significant age-related nuclear cataract, Right Eye  Procedure: Removal of cataract via phacoemulsification and insertion of intra-ocular lens Kelsey Carr and [name]  +22.0D into the capsular bag of the Right Eye  Attending surgeon: Gerda Diss. Jhovanny Guinta, MD, MA  Anesthesia: MAC, Topical Akten  Complications: None  Estimated Blood Loss: <33m (minimal)  Specimens: None  Implants: As above  Indications:  Visually significant age-related cataract, Right Eye  Procedure:  The patient was seen and identified in the pre-operative area. The operative eye was identified and dilated.  The operative eye was marked.  Topical anesthesia was administered to the operative eye.     The patient was then to the operative suite and placed in the supine position.  A timeout was performed confirming the patient, procedure to be performed, and all other relevant information.   The patient's face was prepped and draped in the usual fashion for intra-ocular surgery.  A lid speculum was placed into the operative eye and the surgical microscope moved into place and focused.  A superotemporal paracentesis was created using a 20 gauge paracentesis blade.  Shugarcaine was injected into the anterior chamber.  Viscoelastic was injected into the anterior chamber.  A temporal clear-corneal main wound incision was created using a 2.477mmicrokeratome.  A continuous curvilinear capsulorrhexis was initiated using an irrigating cystitome and completed using capsulorrhexis forceps.  Hydrodissection and hydrodeliniation were performed.  Viscoelastic was injected into the anterior chamber.  A phacoemulsification handpiece and a chopper as a second instrument were used to remove the nucleus and epinucleus. The irrigation/aspiration handpiece was used to remove any  remaining cortical material.   The capsular bag was reinflated with viscoelastic, checked, and found to be intact.  The intraocular lens was inserted into the capsular bag.  The irrigation/aspiration handpiece was used to remove any remaining viscoelastic.  The clear corneal wound and paracentesis wounds were then hydrated and checked with Weck-Cels to be watertight.  The lid-speculum and drape was removed, and the patient's face was cleaned with a wet and dry 4x4.  Maxitrol was instilled in the eye before a clear shield was taped over the eye. The patient was taken to the post-operative care unit in good condition, having tolerated the procedure well.  Post-Op Instructions: [facility] and will receive all other orders and instructions.

## 2020-08-08 NOTE — Anesthesia Preprocedure Evaluation (Signed)
Anesthesia Evaluation  Patient identified by MRN, date of birth, ID band Patient awake    Reviewed: Allergy & Precautions, NPO status , Patient's Chart, lab work & pertinent test results  Airway Mallampati: II  TM Distance: >3 FB Neck ROM: Full    Dental  (+) Dental Advisory Given Crowns :   Pulmonary neg pulmonary ROS,    Pulmonary exam normal breath sounds clear to auscultation       Cardiovascular Exercise Tolerance: Good Normal cardiovascular exam Rhythm:Regular Rate:Normal     Neuro/Psych negative neurological ROS  negative psych ROS   GI/Hepatic negative GI ROS, Neg liver ROS,   Endo/Other  diabetes, Well Controlled, Type 2, Oral Hypoglycemic Agents  Renal/GU negative Renal ROS     Musculoskeletal negative musculoskeletal ROS (+)   Abdominal   Peds  Hematology negative hematology ROS (+)   Anesthesia Other Findings   Reproductive/Obstetrics negative OB ROS                             Anesthesia Physical Anesthesia Plan  ASA: 2  Anesthesia Plan: MAC   Post-op Pain Management:    Induction:   PONV Risk Score and Plan:   Airway Management Planned: Nasal Cannula and Natural Airway  Additional Equipment:   Intra-op Plan:   Post-operative Plan:   Informed Consent: I have reviewed the patients History and Physical, chart, labs and discussed the procedure including the risks, benefits and alternatives for the proposed anesthesia with the patient or authorized representative who has indicated his/her understanding and acceptance.       Plan Discussed with: CRNA and Surgeon  Anesthesia Plan Comments:         Anesthesia Quick Evaluation

## 2020-08-08 NOTE — Discharge Instructions (Signed)
Please discharge patient when stable, will follow up today with Dr. Aldrick Derrig at the [facility] office immediately following discharge.  Leave shield in place until visit.  All paperwork with discharge instructions will be given at the office.  [facility] Address:  [address]  [place], [place] [address]  

## 2020-08-08 NOTE — Interval H&P Note (Signed)
History and Physical Interval Note:  08/08/2020 12:05 PM  [name]  has presented today for surgery, with the diagnosis of Nuclear sclerotic cataract - Right eye.  The various methods of treatment have been discussed with the patient and family. After consideration of risks, benefits and other options for treatment, the patient has consented to  Procedure(s) with comments: CATARACT EXTRACTION PHACO AND INTRAOCULAR LENS PLACEMENT (IOC) (Right) - right as a surgical intervention.  The patient's history has been reviewed, patient examined, no change in status, stable for surgery.  I have reviewed the patient's chart and labs.  Questions were answered to the patient's satisfaction.     Kelsey Carr

## 2020-08-09 ENCOUNTER — Encounter (HOSPITAL_COMMUNITY): Payer: Self-pay | Admitting: Ophthalmology

## 2020-08-26 ENCOUNTER — Encounter (HOSPITAL_COMMUNITY): Payer: PPO

## 2020-08-30 ENCOUNTER — Ambulatory Visit: Admit: 2020-08-30 | Payer: PPO | Admitting: Ophthalmology

## 2020-08-30 SURGERY — PHACOEMULSIFICATION, CATARACT, WITH IOL INSERTION
Anesthesia: Monitor Anesthesia Care | Laterality: Left

## 2020-09-04 DIAGNOSIS — H2512 Age-related nuclear cataract, left eye: Secondary | ICD-10-CM | POA: Diagnosis not present

## 2020-09-04 DIAGNOSIS — Z961 Presence of intraocular lens: Secondary | ICD-10-CM | POA: Diagnosis not present

## 2020-09-04 DIAGNOSIS — E113411 Type 2 diabetes mellitus with severe nonproliferative diabetic retinopathy with macular edema, right eye: Secondary | ICD-10-CM | POA: Diagnosis not present

## 2020-09-04 DIAGNOSIS — E113412 Type 2 diabetes mellitus with severe nonproliferative diabetic retinopathy with macular edema, left eye: Secondary | ICD-10-CM | POA: Diagnosis not present

## 2020-09-04 DIAGNOSIS — H40023 Open angle with borderline findings, high risk, bilateral: Secondary | ICD-10-CM | POA: Diagnosis not present

## 2020-09-17 DIAGNOSIS — Z961 Presence of intraocular lens: Secondary | ICD-10-CM | POA: Diagnosis not present

## 2020-09-17 DIAGNOSIS — E113412 Type 2 diabetes mellitus with severe nonproliferative diabetic retinopathy with macular edema, left eye: Secondary | ICD-10-CM | POA: Diagnosis not present

## 2020-09-17 DIAGNOSIS — H2512 Age-related nuclear cataract, left eye: Secondary | ICD-10-CM | POA: Diagnosis not present

## 2020-09-17 DIAGNOSIS — E113411 Type 2 diabetes mellitus with severe nonproliferative diabetic retinopathy with macular edema, right eye: Secondary | ICD-10-CM | POA: Diagnosis not present

## 2020-09-17 DIAGNOSIS — H40023 Open angle with borderline findings, high risk, bilateral: Secondary | ICD-10-CM | POA: Diagnosis not present

## 2020-10-31 DEATH — deceased

## 2021-06-19 IMAGING — MG MM DIGITAL SCREENING BILAT W/ TOMO AND CAD
6 of 10 series · 6 of 30 positions shown · non-contrast
Comparison: Previous exam(s).

CLINICAL DATA: Screening.

EXAM:
DIGITAL SCREENING BILATERAL MAMMOGRAM WITH TOMOSYNTHESIS AND CAD
TECHNIQUE: Bilateral screening digital craniocaudal and mediolateral oblique
mammograms were obtained. Bilateral screening digital breast
tomosynthesis was performed. The images were evaluated with
computer-aided detection.

[L CV synth-2D]
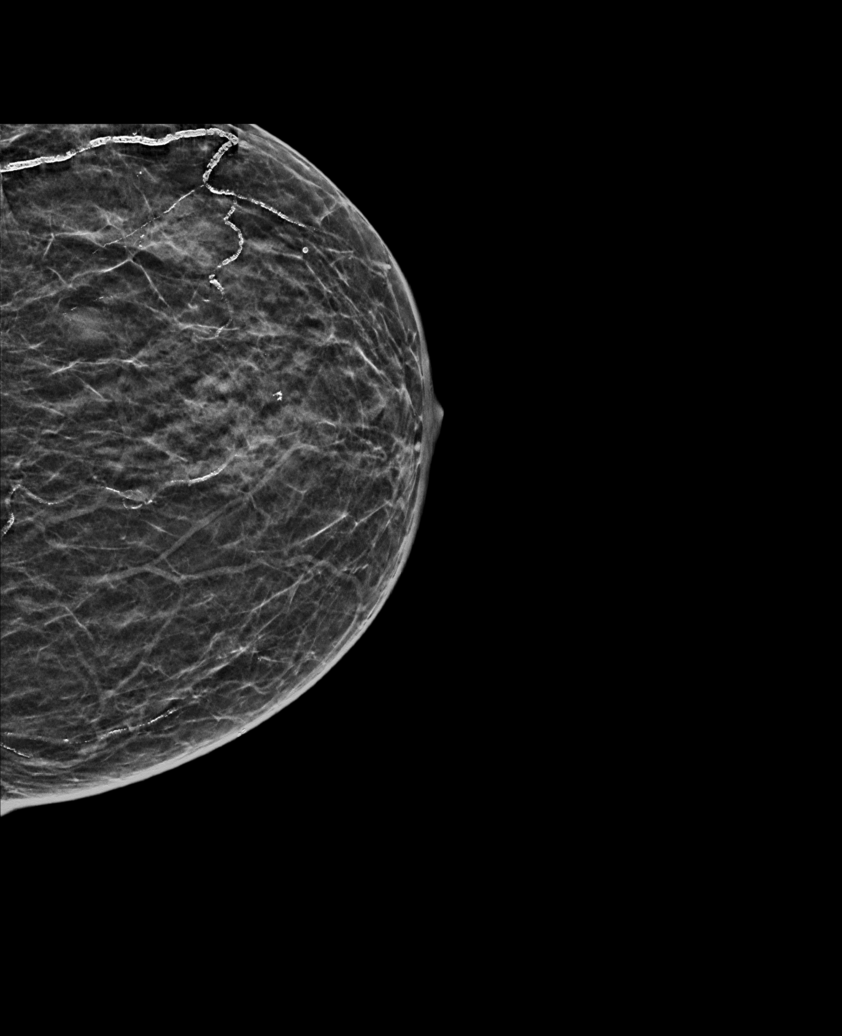

[R MLO synth-2D]
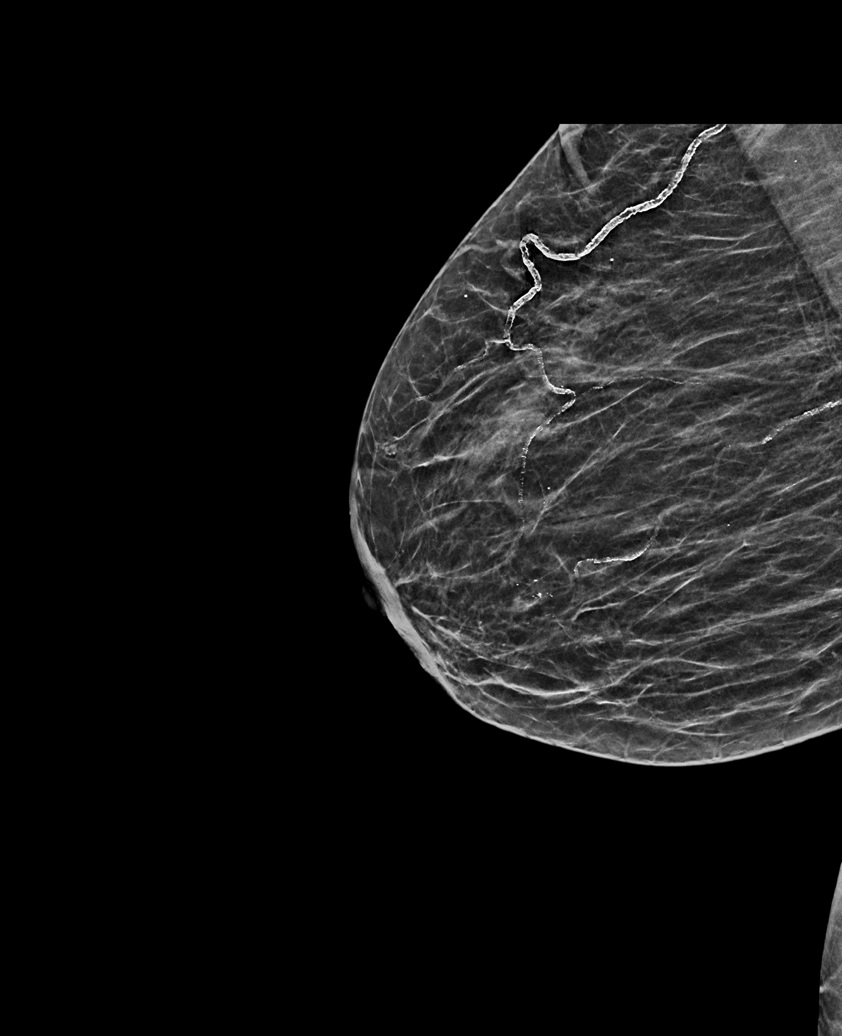

[R CC synth-2D]
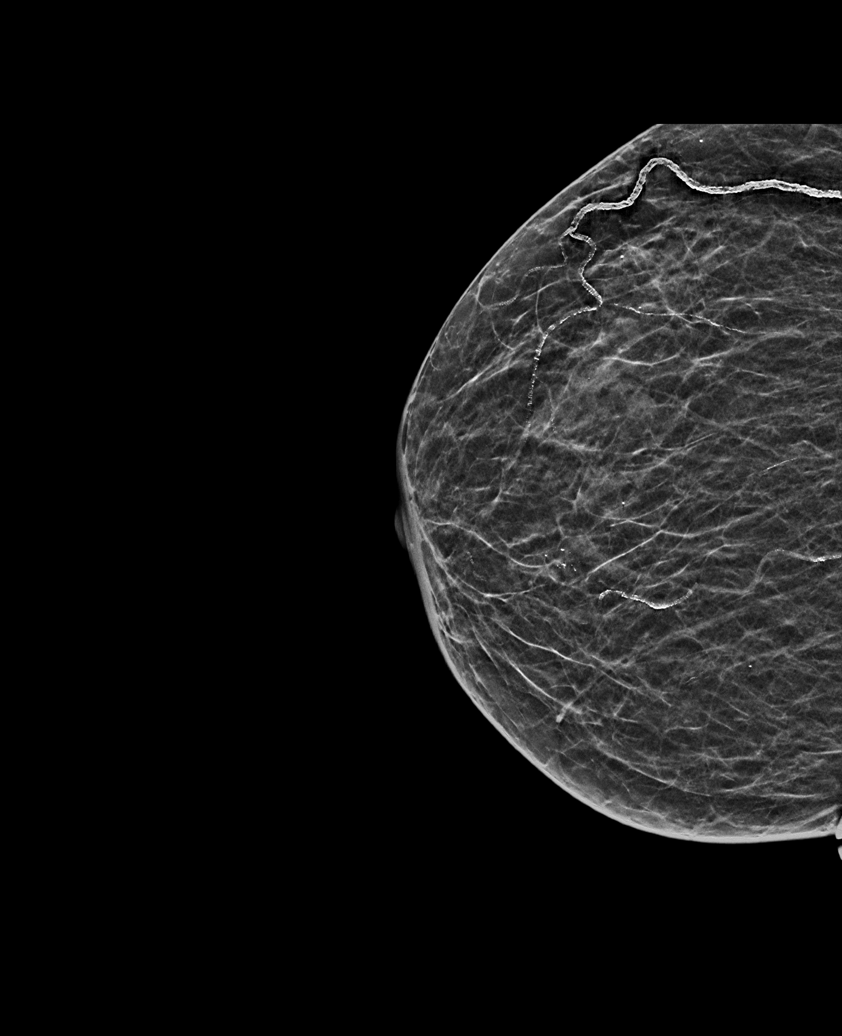

[L MLO synth-2D]
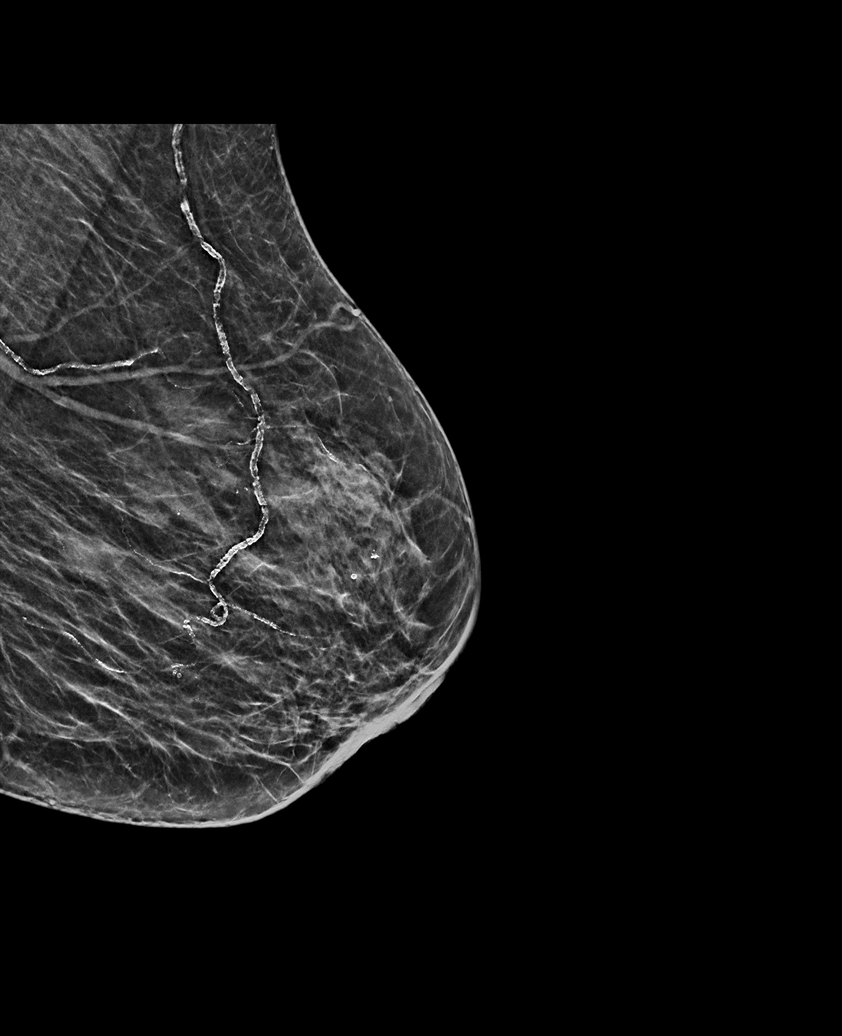

[L CC synth-2D]
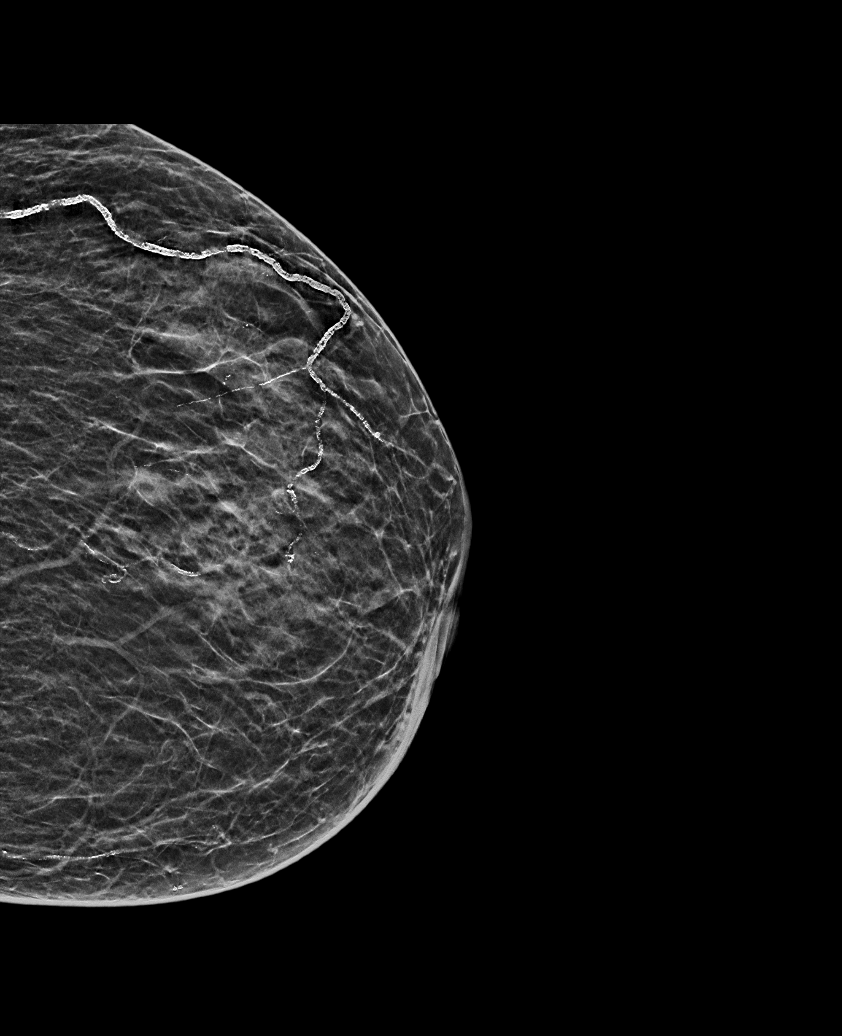

[L CV tomo · tomo slice 19/36.0]
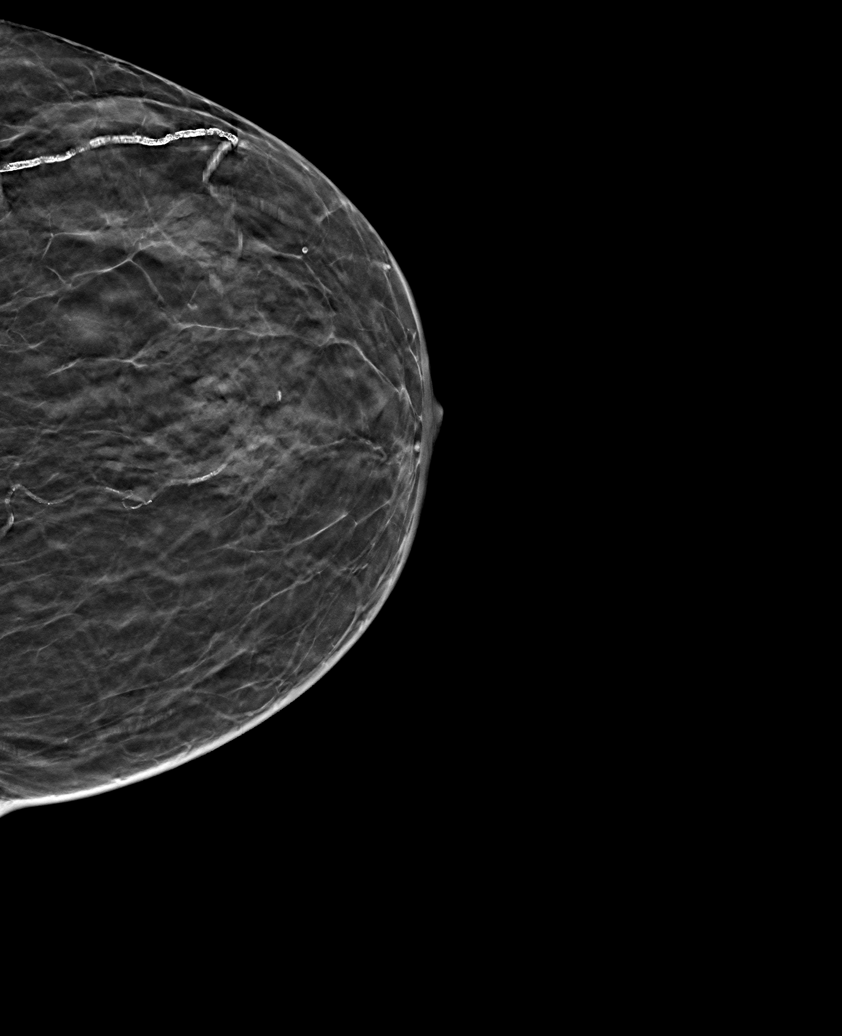

[6 of 30 positions shown; findings below may reference images not displayed]

ACR Breast Density Category b: There are scattered areas of
fibroglandular density.
FINDINGS: There are no findings suspicious for malignancy. The images were
evaluated with computer-aided detection.
IMPRESSION: No mammographic evidence of malignancy. A result letter of this
screening mammogram will be mailed directly to the patient.

RECOMMENDATION:
Screening mammogram in one year. (Code:[id number])

BI-RADS CATEGORY  1: Negative.
